# Patient Record
Sex: Female | Born: 1965
Health system: Southern US, Community
[De-identification: ages and names within clinical notes are randomized; demographics above are authoritative.]

## PROBLEM LIST (undated history)

## (undated) DIAGNOSIS — K029 Dental caries, unspecified: Secondary | ICD-10-CM

## (undated) DIAGNOSIS — G709 Myoneural disorder, unspecified: Secondary | ICD-10-CM

## (undated) DIAGNOSIS — E119 Type 2 diabetes mellitus without complications: Secondary | ICD-10-CM

## (undated) HISTORY — DX: Type 2 diabetes mellitus without complications: E11.9

## (undated) HISTORY — PX: LAPAROSCOPIC GASTRIC SLEEVE RESECTION: SHX5895

## (undated) HISTORY — DX: Myoneural disorder, unspecified: G70.9

## (undated) HISTORY — DX: Dental caries, unspecified: K02.9

---

## 2017-08-17 DIAGNOSIS — E1121 Type 2 diabetes mellitus with diabetic nephropathy: Secondary | ICD-10-CM | POA: Diagnosis not present

## 2017-08-17 DIAGNOSIS — N951 Menopausal and female climacteric states: Secondary | ICD-10-CM | POA: Diagnosis not present

## 2017-08-17 DIAGNOSIS — F17200 Nicotine dependence, unspecified, uncomplicated: Secondary | ICD-10-CM | POA: Diagnosis not present

## 2017-09-17 DIAGNOSIS — E1121 Type 2 diabetes mellitus with diabetic nephropathy: Secondary | ICD-10-CM | POA: Diagnosis not present

## 2017-09-17 DIAGNOSIS — M79604 Pain in right leg: Secondary | ICD-10-CM | POA: Diagnosis not present

## 2018-01-01 DIAGNOSIS — L0100 Impetigo, unspecified: Secondary | ICD-10-CM | POA: Diagnosis not present

## 2018-01-01 DIAGNOSIS — M79604 Pain in right leg: Secondary | ICD-10-CM | POA: Diagnosis not present

## 2018-01-01 DIAGNOSIS — R300 Dysuria: Secondary | ICD-10-CM | POA: Diagnosis not present

## 2018-01-01 DIAGNOSIS — E1121 Type 2 diabetes mellitus with diabetic nephropathy: Secondary | ICD-10-CM | POA: Diagnosis not present

## 2018-05-22 DIAGNOSIS — R6883 Chills (without fever): Secondary | ICD-10-CM | POA: Diagnosis not present

## 2018-05-22 DIAGNOSIS — B351 Tinea unguium: Secondary | ICD-10-CM | POA: Diagnosis not present

## 2018-05-22 DIAGNOSIS — F17210 Nicotine dependence, cigarettes, uncomplicated: Secondary | ICD-10-CM | POA: Diagnosis not present

## 2018-05-22 DIAGNOSIS — E119 Type 2 diabetes mellitus without complications: Secondary | ICD-10-CM | POA: Diagnosis not present

## 2018-05-22 DIAGNOSIS — K0889 Other specified disorders of teeth and supporting structures: Secondary | ICD-10-CM | POA: Diagnosis not present

## 2018-05-22 DIAGNOSIS — B379 Candidiasis, unspecified: Secondary | ICD-10-CM | POA: Diagnosis not present

## 2018-05-22 DIAGNOSIS — Z6833 Body mass index (BMI) 33.0-33.9, adult: Secondary | ICD-10-CM | POA: Diagnosis not present

## 2018-05-31 DIAGNOSIS — M545 Low back pain: Secondary | ICD-10-CM | POA: Diagnosis not present

## 2018-05-31 DIAGNOSIS — Y92481 Parking lot as the place of occurrence of the external cause: Secondary | ICD-10-CM | POA: Diagnosis not present

## 2018-05-31 DIAGNOSIS — Z9884 Bariatric surgery status: Secondary | ICD-10-CM | POA: Diagnosis not present

## 2018-05-31 DIAGNOSIS — E119 Type 2 diabetes mellitus without complications: Secondary | ICD-10-CM | POA: Diagnosis not present

## 2018-05-31 DIAGNOSIS — Z7984 Long term (current) use of oral hypoglycemic drugs: Secondary | ICD-10-CM | POA: Diagnosis not present

## 2018-05-31 DIAGNOSIS — F17210 Nicotine dependence, cigarettes, uncomplicated: Secondary | ICD-10-CM | POA: Diagnosis not present

## 2018-05-31 DIAGNOSIS — S39012A Strain of muscle, fascia and tendon of lower back, initial encounter: Secondary | ICD-10-CM | POA: Diagnosis not present

## 2018-05-31 DIAGNOSIS — Z79899 Other long term (current) drug therapy: Secondary | ICD-10-CM | POA: Diagnosis not present

## 2018-07-10 DIAGNOSIS — M542 Cervicalgia: Secondary | ICD-10-CM | POA: Diagnosis not present

## 2018-07-10 DIAGNOSIS — F17210 Nicotine dependence, cigarettes, uncomplicated: Secondary | ICD-10-CM | POA: Diagnosis not present

## 2018-07-10 DIAGNOSIS — W01198A Fall on same level from slipping, tripping and stumbling with subsequent striking against other object, initial encounter: Secondary | ICD-10-CM | POA: Diagnosis not present

## 2018-07-10 DIAGNOSIS — S39012A Strain of muscle, fascia and tendon of lower back, initial encounter: Secondary | ICD-10-CM | POA: Diagnosis not present

## 2018-07-10 DIAGNOSIS — Z6833 Body mass index (BMI) 33.0-33.9, adult: Secondary | ICD-10-CM | POA: Diagnosis not present

## 2018-07-10 DIAGNOSIS — M545 Low back pain: Secondary | ICD-10-CM | POA: Diagnosis not present

## 2018-07-10 DIAGNOSIS — E119 Type 2 diabetes mellitus without complications: Secondary | ICD-10-CM | POA: Diagnosis not present

## 2018-07-10 DIAGNOSIS — R202 Paresthesia of skin: Secondary | ICD-10-CM | POA: Diagnosis not present

## 2018-09-03 DIAGNOSIS — Z7984 Long term (current) use of oral hypoglycemic drugs: Secondary | ICD-10-CM | POA: Diagnosis not present

## 2018-09-03 DIAGNOSIS — F17210 Nicotine dependence, cigarettes, uncomplicated: Secondary | ICD-10-CM | POA: Diagnosis not present

## 2018-09-03 DIAGNOSIS — R509 Fever, unspecified: Secondary | ICD-10-CM | POA: Diagnosis not present

## 2018-09-03 DIAGNOSIS — K5900 Constipation, unspecified: Secondary | ICD-10-CM | POA: Diagnosis not present

## 2018-09-03 DIAGNOSIS — R1110 Vomiting, unspecified: Secondary | ICD-10-CM | POA: Diagnosis not present

## 2018-09-03 DIAGNOSIS — Z880 Allergy status to penicillin: Secondary | ICD-10-CM | POA: Diagnosis not present

## 2018-09-03 DIAGNOSIS — R05 Cough: Secondary | ICD-10-CM | POA: Diagnosis not present

## 2018-09-03 DIAGNOSIS — R197 Diarrhea, unspecified: Secondary | ICD-10-CM | POA: Diagnosis not present

## 2018-09-03 DIAGNOSIS — E119 Type 2 diabetes mellitus without complications: Secondary | ICD-10-CM | POA: Diagnosis not present

## 2018-10-13 DIAGNOSIS — J3489 Other specified disorders of nose and nasal sinuses: Secondary | ICD-10-CM | POA: Diagnosis not present

## 2018-10-13 DIAGNOSIS — E1142 Type 2 diabetes mellitus with diabetic polyneuropathy: Secondary | ICD-10-CM | POA: Diagnosis not present

## 2018-10-13 DIAGNOSIS — R05 Cough: Secondary | ICD-10-CM | POA: Diagnosis not present

## 2018-10-13 DIAGNOSIS — R110 Nausea: Secondary | ICD-10-CM | POA: Diagnosis not present

## 2018-10-13 DIAGNOSIS — J302 Other seasonal allergic rhinitis: Secondary | ICD-10-CM | POA: Diagnosis not present

## 2018-10-13 DIAGNOSIS — K219 Gastro-esophageal reflux disease without esophagitis: Secondary | ICD-10-CM | POA: Diagnosis not present

## 2018-10-13 DIAGNOSIS — F17210 Nicotine dependence, cigarettes, uncomplicated: Secondary | ICD-10-CM | POA: Diagnosis not present

## 2018-12-23 DIAGNOSIS — E1121 Type 2 diabetes mellitus with diabetic nephropathy: Secondary | ICD-10-CM | POA: Diagnosis not present

## 2018-12-23 DIAGNOSIS — G2581 Restless legs syndrome: Secondary | ICD-10-CM | POA: Diagnosis not present

## 2018-12-23 DIAGNOSIS — M5440 Lumbago with sciatica, unspecified side: Secondary | ICD-10-CM | POA: Diagnosis not present

## 2018-12-23 DIAGNOSIS — J019 Acute sinusitis, unspecified: Secondary | ICD-10-CM | POA: Diagnosis not present

## 2018-12-23 DIAGNOSIS — Z79899 Other long term (current) drug therapy: Secondary | ICD-10-CM | POA: Diagnosis not present

## 2018-12-25 DIAGNOSIS — M5416 Radiculopathy, lumbar region: Secondary | ICD-10-CM | POA: Diagnosis not present

## 2018-12-25 DIAGNOSIS — M6281 Muscle weakness (generalized): Secondary | ICD-10-CM | POA: Diagnosis not present

## 2018-12-25 DIAGNOSIS — M545 Low back pain: Secondary | ICD-10-CM | POA: Diagnosis not present

## 2018-12-27 DIAGNOSIS — E1121 Type 2 diabetes mellitus with diabetic nephropathy: Secondary | ICD-10-CM | POA: Diagnosis not present

## 2018-12-27 DIAGNOSIS — Z6838 Body mass index (BMI) 38.0-38.9, adult: Secondary | ICD-10-CM | POA: Diagnosis not present

## 2018-12-27 DIAGNOSIS — M5440 Lumbago with sciatica, unspecified side: Secondary | ICD-10-CM | POA: Diagnosis not present

## 2019-01-01 DIAGNOSIS — M5416 Radiculopathy, lumbar region: Secondary | ICD-10-CM | POA: Diagnosis not present

## 2019-01-01 DIAGNOSIS — M6281 Muscle weakness (generalized): Secondary | ICD-10-CM | POA: Diagnosis not present

## 2019-01-01 DIAGNOSIS — M545 Low back pain: Secondary | ICD-10-CM | POA: Diagnosis not present

## 2019-01-27 DIAGNOSIS — M5416 Radiculopathy, lumbar region: Secondary | ICD-10-CM | POA: Diagnosis not present

## 2019-01-27 DIAGNOSIS — M6281 Muscle weakness (generalized): Secondary | ICD-10-CM | POA: Diagnosis not present

## 2019-01-27 DIAGNOSIS — M545 Low back pain: Secondary | ICD-10-CM | POA: Diagnosis not present

## 2019-02-04 DIAGNOSIS — M545 Low back pain: Secondary | ICD-10-CM | POA: Diagnosis not present

## 2019-02-04 DIAGNOSIS — M6530 Trigger finger, unspecified finger: Secondary | ICD-10-CM | POA: Diagnosis not present

## 2019-02-04 DIAGNOSIS — M6281 Muscle weakness (generalized): Secondary | ICD-10-CM | POA: Diagnosis not present

## 2019-02-04 DIAGNOSIS — M5416 Radiculopathy, lumbar region: Secondary | ICD-10-CM | POA: Diagnosis not present

## 2019-02-06 DIAGNOSIS — E1121 Type 2 diabetes mellitus with diabetic nephropathy: Secondary | ICD-10-CM | POA: Diagnosis not present

## 2019-02-06 DIAGNOSIS — M65312 Trigger thumb, left thumb: Secondary | ICD-10-CM | POA: Diagnosis not present

## 2019-02-06 DIAGNOSIS — K219 Gastro-esophageal reflux disease without esophagitis: Secondary | ICD-10-CM | POA: Diagnosis not present

## 2019-02-06 DIAGNOSIS — M65332 Trigger finger, left middle finger: Secondary | ICD-10-CM | POA: Diagnosis not present

## 2019-03-03 DIAGNOSIS — M5416 Radiculopathy, lumbar region: Secondary | ICD-10-CM | POA: Diagnosis not present

## 2019-03-03 DIAGNOSIS — M6281 Muscle weakness (generalized): Secondary | ICD-10-CM | POA: Diagnosis not present

## 2019-03-03 DIAGNOSIS — M6530 Trigger finger, unspecified finger: Secondary | ICD-10-CM | POA: Diagnosis not present

## 2019-03-03 DIAGNOSIS — M545 Low back pain: Secondary | ICD-10-CM | POA: Diagnosis not present

## 2019-03-04 DIAGNOSIS — N390 Urinary tract infection, site not specified: Secondary | ICD-10-CM | POA: Diagnosis not present

## 2019-03-04 DIAGNOSIS — Z6839 Body mass index (BMI) 39.0-39.9, adult: Secondary | ICD-10-CM | POA: Diagnosis not present

## 2019-03-04 DIAGNOSIS — M65332 Trigger finger, left middle finger: Secondary | ICD-10-CM | POA: Diagnosis not present

## 2019-03-17 DIAGNOSIS — E1121 Type 2 diabetes mellitus with diabetic nephropathy: Secondary | ICD-10-CM | POA: Diagnosis not present

## 2019-03-17 DIAGNOSIS — N390 Urinary tract infection, site not specified: Secondary | ICD-10-CM | POA: Diagnosis not present

## 2019-05-28 DIAGNOSIS — E1121 Type 2 diabetes mellitus with diabetic nephropathy: Secondary | ICD-10-CM | POA: Diagnosis not present

## 2019-06-16 DIAGNOSIS — M65331 Trigger finger, right middle finger: Secondary | ICD-10-CM | POA: Diagnosis not present

## 2019-06-16 DIAGNOSIS — M65312 Trigger thumb, left thumb: Secondary | ICD-10-CM | POA: Diagnosis not present

## 2019-06-16 DIAGNOSIS — M65332 Trigger finger, left middle finger: Secondary | ICD-10-CM | POA: Diagnosis not present

## 2019-06-16 DIAGNOSIS — E1121 Type 2 diabetes mellitus with diabetic nephropathy: Secondary | ICD-10-CM | POA: Diagnosis not present

## 2019-09-03 DIAGNOSIS — J988 Other specified respiratory disorders: Secondary | ICD-10-CM | POA: Diagnosis not present

## 2019-09-03 DIAGNOSIS — Z20828 Contact with and (suspected) exposure to other viral communicable diseases: Secondary | ICD-10-CM | POA: Diagnosis not present

## 2019-09-10 DIAGNOSIS — H6690 Otitis media, unspecified, unspecified ear: Secondary | ICD-10-CM | POA: Diagnosis not present

## 2019-09-10 DIAGNOSIS — G2581 Restless legs syndrome: Secondary | ICD-10-CM | POA: Diagnosis not present

## 2019-09-10 DIAGNOSIS — E1121 Type 2 diabetes mellitus with diabetic nephropathy: Secondary | ICD-10-CM | POA: Diagnosis not present

## 2019-09-10 DIAGNOSIS — N63 Unspecified lump in unspecified breast: Secondary | ICD-10-CM | POA: Diagnosis not present

## 2019-10-29 DIAGNOSIS — R109 Unspecified abdominal pain: Secondary | ICD-10-CM | POA: Diagnosis not present

## 2019-10-29 DIAGNOSIS — Z20828 Contact with and (suspected) exposure to other viral communicable diseases: Secondary | ICD-10-CM | POA: Diagnosis not present

## 2019-12-08 DIAGNOSIS — Z79899 Other long term (current) drug therapy: Secondary | ICD-10-CM | POA: Diagnosis not present

## 2019-12-08 DIAGNOSIS — E1121 Type 2 diabetes mellitus with diabetic nephropathy: Secondary | ICD-10-CM | POA: Diagnosis not present

## 2019-12-08 DIAGNOSIS — Z6839 Body mass index (BMI) 39.0-39.9, adult: Secondary | ICD-10-CM | POA: Diagnosis not present

## 2019-12-08 DIAGNOSIS — M5440 Lumbago with sciatica, unspecified side: Secondary | ICD-10-CM | POA: Diagnosis not present

## 2019-12-08 DIAGNOSIS — B373 Candidiasis of vulva and vagina: Secondary | ICD-10-CM | POA: Diagnosis not present

## 2019-12-11 DIAGNOSIS — M65331 Trigger finger, right middle finger: Secondary | ICD-10-CM | POA: Diagnosis not present

## 2020-01-08 DIAGNOSIS — M2550 Pain in unspecified joint: Secondary | ICD-10-CM | POA: Diagnosis not present

## 2020-01-08 DIAGNOSIS — M5440 Lumbago with sciatica, unspecified side: Secondary | ICD-10-CM | POA: Diagnosis not present

## 2020-01-08 DIAGNOSIS — E1121 Type 2 diabetes mellitus with diabetic nephropathy: Secondary | ICD-10-CM | POA: Diagnosis not present

## 2020-01-08 DIAGNOSIS — Z6839 Body mass index (BMI) 39.0-39.9, adult: Secondary | ICD-10-CM | POA: Diagnosis not present

## 2020-02-02 DIAGNOSIS — M5440 Lumbago with sciatica, unspecified side: Secondary | ICD-10-CM | POA: Diagnosis not present

## 2020-02-02 DIAGNOSIS — E1165 Type 2 diabetes mellitus with hyperglycemia: Secondary | ICD-10-CM | POA: Diagnosis not present

## 2020-02-02 DIAGNOSIS — E1121 Type 2 diabetes mellitus with diabetic nephropathy: Secondary | ICD-10-CM | POA: Diagnosis not present

## 2020-02-03 DIAGNOSIS — R6889 Other general symptoms and signs: Secondary | ICD-10-CM | POA: Diagnosis not present

## 2020-03-08 ENCOUNTER — Ambulatory Visit: Payer: Self-pay | Admitting: Internal Medicine

## 2020-03-08 DIAGNOSIS — Z0289 Encounter for other administrative examinations: Secondary | ICD-10-CM

## 2020-04-26 ENCOUNTER — Ambulatory Visit: Payer: Self-pay | Admitting: Internal Medicine

## 2020-05-24 ENCOUNTER — Ambulatory Visit: Payer: Self-pay | Admitting: Internal Medicine

## 2020-05-24 ENCOUNTER — Encounter: Payer: Self-pay | Admitting: Internal Medicine

## 2020-05-24 DIAGNOSIS — Z0289 Encounter for other administrative examinations: Secondary | ICD-10-CM

## 2020-05-24 NOTE — Progress Notes (Deleted)
Name: Amber Vincent  MRN/ DOB: [id number], Mar 18, 1966   Age/ Sex: 54 y.o., female    PCP: No primary care provider on file.   Reason for Endocrinology Evaluation: Type 2 Diabetes Mellitus     Date of Initial Endocrinology Visit: 05/24/2020     PATIENT IDENTIFIER: Amber Vincent is a 54 y.o. female with a past medical history of T2Dm, Hx of weight loss surgery (gastric sleeve). The patient presented for initial endocrinology clinic visit on 05/24/2020 for consultative assistance with her diabetes management.    HPI: Amber Vincent was    Diagnosed with DM in 2017 Prior Medications tried/Intolerance: Bydureon (cost prohibitive), Trulicy- weak and achy all over. lantus caused facial breakout and switched to Toujeo. Ozempic started 01/2020 Currently checking blood sugars *** x / day,  before breakfast and ***.  Hypoglycemia episodes : ***               Symptoms: ***                 Frequency: ***/  Hemoglobin A1c has ranged from 9.5% in 2020, peaking at 13.5% in 2021. Patient required assistance for hypoglycemia:  Patient has required hospitalization within the last 1 year from hyper or hypoglycemia:   In terms of diet, the patient ***   HOME DIABETES REGIMEN: Metformin  Toujeo  Ozempic   Statin: {Yes/No:11203} ACE-I/ARB: {YES/NO:17245} Prior Diabetic Education: {Yes/No:11203}   METER DOWNLOAD SUMMARY: Date range evaluated: *** Fingerstick Blood Glucose Tests = *** Average Number Tests/Day = *** Overall Mean FS Glucose = *** Standard Deviation = ***  BG Ranges: Low = *** High = ***   Hypoglycemic Events/30 Days: BG < 50 = *** Episodes of symptomatic severe hypoglycemia = ***   DIABETIC COMPLICATIONS: Microvascular complications:   Neuropathy  Denies: ***  Last eye exam: Completed   Macrovascular complications:   ***  Denies: CAD, PVD, CVA   PAST HISTORY:  Past Medical History: No past medical history on file.  Past Surgical History: *** The  histories are not reviewed yet. Please review them in the "History" navigator section and refresh this SmartLink.   Social History:  has no history on file for tobacco, alcohol, and drug.  Family History: No family history on file.   HOME MEDICATIONS: Allergies as of 05/24/2020   Not on File     Medication List    as of May 24, 2020  8:09 AM   You have not been prescribed any medications.      ALLERGIES: Not on File   REVIEW OF SYSTEMS: A comprehensive ROS was conducted with the patient and is negative except as per HPI   OBJECTIVE:   VITAL SIGNS: There were no vitals taken for this visit.   PHYSICAL EXAM:  General: Pt appears well and is in NAD  HEENT:  Eyes: External eye exam normal without stare, lid lag or exophthalmos.  EOM intact  Neck: General: Supple without adenopathy or carotid bruits. Thyroid: Thyroid size normal.  No goiter or nodules appreciated. No thyroid bruit.  Lungs: Clear with good BS bilat with no rales, rhonchi, or wheezes  Heart: RRR with normal S1 and S2 and no gallops; no murmurs; no rub  Abdomen: Normoactive bowel sounds, soft, nontender, without masses or organomegaly palpable  Extremities:  Lower extremities - No pretibial edema. No lesions.  Skin: Normal texture and temperature to palpation.  Neuro: MS is good with appropriate affect, pt is alert and Ox3    DM  foot exam:    DATA REVIEWED: 01/08/2020 A1c 12.4% BUN/Cr 10/0.58 GFR 107        ASSESSMENT / PLAN / RECOMMENDATIONS:   1) Type 2 Diabetes Mellitus, Poorly controlled, With Neuropathic complications - Most recent A1c of *** %. Goal A1c < 7.0 %.    Plan: GENERAL:  ***  MEDICATIONS:  ***  EDUCATION / INSTRUCTIONS:  BG monitoring instructions: Patient is instructed to check her blood sugars *** times a day, ***.  Call [facility] if: BG persistently < 70 or > 300. . I reviewed the Rule of 15 for the treatment of hypoglycemia in detail with the  patient. Literature supplied.   2) Diabetic complications:   Eye: Does *** have known diabetic retinopathy.   Neuro/ Feet: Does *** have known diabetic peripheral neuropathy.  Renal: Patient does *** have known baseline CKD. She is *** on an ACEI/ARB at present.Check urine albumin/creatinine ratio yearly starting at time of diagnosis. If albuminuria is positive, treatment is geared toward better glucose, blood pressure control and use of ACE inhibitors or ARBs. Monitor electrolytes and creatinine once to twice yearly.   3) Lipids: Patient is *** on a statin.    4) Hypertension: ***  at goal of < 140/90 mmHg.       Signed electronically by: Mack Guise, MD  [facility] Endocrinology  [facility] [address] [place], [place] [address] Phone: [phone] FAX: [phone]   CC: No primary care provider on file. No primary provider on file. Phone: None  Fax: None    Return to Endocrinology clinic as below: Future Appointments  Date Time Provider [place]  05/24/2020  2:40 PM Barbette Mcglaun, Melanie Crazier, MD [facility] None

## 2020-06-23 DIAGNOSIS — F17210 Nicotine dependence, cigarettes, uncomplicated: Secondary | ICD-10-CM | POA: Diagnosis not present

## 2020-06-23 DIAGNOSIS — Z7984 Long term (current) use of oral hypoglycemic drugs: Secondary | ICD-10-CM | POA: Diagnosis not present

## 2020-06-23 DIAGNOSIS — R55 Syncope and collapse: Secondary | ICD-10-CM | POA: Diagnosis not present

## 2020-06-23 DIAGNOSIS — K0889 Other specified disorders of teeth and supporting structures: Secondary | ICD-10-CM | POA: Diagnosis not present

## 2020-06-23 DIAGNOSIS — R5383 Other fatigue: Secondary | ICD-10-CM | POA: Diagnosis not present

## 2020-06-23 DIAGNOSIS — E1165 Type 2 diabetes mellitus with hyperglycemia: Secondary | ICD-10-CM | POA: Diagnosis not present

## 2020-08-09 ENCOUNTER — Other Ambulatory Visit: Payer: Self-pay

## 2020-08-09 ENCOUNTER — Telehealth: Payer: Self-pay

## 2020-08-09 DIAGNOSIS — N644 Mastodynia: Secondary | ICD-10-CM

## 2020-08-09 DIAGNOSIS — N6320 Unspecified lump in the left breast, unspecified quadrant: Secondary | ICD-10-CM

## 2020-08-09 NOTE — Telephone Encounter (Signed)
Patient calls and states she has severe right breast pain with clear discharge from the nipple, lump in left breast ongoing x 6 months, lump is getting bigger, pain in right breast is severe, difficulty wearing a bra. She is concerned as her mom had breast cancer, was HER2+.  Patient states she is currently legally separated from her husband, who provides insurance Nurse, mental health) coverage, and no income, deductible is $3,000, needs to know if she can be seen. Patient informed (per Altha Harm) that she can be seen in our clinic, scheduled for 08/10/2020 @ 1:15pm. Patient accepted appointment, address provided.

## 2020-08-10 ENCOUNTER — Other Ambulatory Visit (HOSPITAL_COMMUNITY)
Admission: RE | Admit: 2020-08-10 | Discharge: 2020-08-10 | Disposition: A | Discharge status: Home / Self Care | Payer: BC Managed Care – PPO | Source: Ambulatory Visit | Attending: Obstetrics and Gynecology | Admitting: Obstetrics and Gynecology

## 2020-08-10 ENCOUNTER — Ambulatory Visit: Payer: BC Managed Care – PPO | Admitting: *Deleted

## 2020-08-10 ENCOUNTER — Other Ambulatory Visit: Payer: Self-pay

## 2020-08-10 ENCOUNTER — Ambulatory Visit: Payer: BC Managed Care – PPO

## 2020-08-10 VITALS — BP 135/80 | Temp 97.1°F | Wt 227.3 lb

## 2020-08-10 DIAGNOSIS — N61 Mastitis without abscess: Secondary | ICD-10-CM | POA: Diagnosis not present

## 2020-08-10 DIAGNOSIS — Z1211 Encounter for screening for malignant neoplasm of colon: Secondary | ICD-10-CM

## 2020-08-10 DIAGNOSIS — N6452 Nipple discharge: Secondary | ICD-10-CM | POA: Diagnosis not present

## 2020-08-10 DIAGNOSIS — Z1239 Encounter for other screening for malignant neoplasm of breast: Secondary | ICD-10-CM

## 2020-08-10 NOTE — Progress Notes (Signed)
Ms. Rhilyn Battle is a 54 y.o. female who presents to [facility] clinic today with complaint of left breast lump x 5 months that has increased in size, right breast clear spontaneous discharge x 2 months, and constant bilateral breast pain. Patient rates the pain at a 10 out of 10.  Pap Smear: Pap smear not completed today. Last Pap smear was 3 years ago at [facility] clinic and was normal per patient. Patient is due for a Pap smear. Patient refused Pap smear today. Patient informed of the importance of routine Pap smears and that the Pap smear is covered by [facility]. Told patient she can call to reschedule. Per patient has no history of an abnormal Pap smear. Last Pap smear result is not available in Epic.   Physical exam: Breasts Right breast slightly larger than left breast that per patient has changed over the past 7 months. No skin abnormalities bilateral breasts. No nipple retraction bilateral breasts. No nipple discharge left breast. Expressed a clear colored discharge from the right breast on exam. Sample of discharge sent to Cytology for evaluation. No lymphadenopathy. No lumps palpated right breast. Palpated a lump within the left breast at 6 o'clock 4 cm from the nipple. Complaints of bilateral diffuse breast pain on exam.     Pelvic/Bimanual Patient refused Pap smear today.   Smoking History: Patient is a current smoker. Discussed smoking cessation with patient. Referred to the [facility] Quitline and gave resources to the free smoking cessation classes at [facility].   Patient Navigation: Patient education provided. Access to services provided for patient through [facility] program.   Colorectal Cancer Screening: Per patient has never had colonoscopy completed. FIT test given to patient to complete and return to [facility]. No complaints today.    Breast and Cervical Cancer Risk Assessment: Patient has family history of her mother having breast cancer. Patient has no known genetic mutations or  history of radiation treatment to the chest before age 49. Patient does not have history of cervical dysplasia, immunocompromised, or DES exposure in-utero.  Risk Assessment    Risk Scores      08/10/2020   Last edited by: Narda Rutherford, LPN   5-year risk: 2 %   Lifetime risk: 15.5 %          A: [facility] exam without pap smear Complaint of right breast discharge, left breast lump, and bilateral breast pain.  P: Referred patient to the [facility] for a diagnostic mammogram. Appointment scheduled Thursday, August 12, 2020 at 1010.  Priscille Heidelberg, RN 08/10/2020 4:49 PM

## 2020-08-10 NOTE — Patient Instructions (Addendum)
Explained breast self awareness with Chip Boer. Patient refused Pap smear today. Let her know [facility] will cover Pap smears every 3 years unless has a history of abnormal Pap smears. Explained the importance of Pap smears and that she is due for a pap smear. Referred patient to the [facility] for a diagnostic mammogram. Appointment scheduled Thursday, August 12, 2020 at 1010. Patient aware of appointment and will be there. Let patient know will follow up with her within the next couple weeks with results of breast discharge by letter or phone. Discussed smoking cessation with patient. Referred to the [facility] Quitline and gave resources to the free smoking cessation classes at [facility]. Amber Vincent verbalized understanding.  Ronika Kelson, Kathaleen Maser, RN 4:49 PM

## 2020-08-11 ENCOUNTER — Other Ambulatory Visit: Payer: Self-pay

## 2020-08-11 ENCOUNTER — Ambulatory Visit (INDEPENDENT_AMBULATORY_CARE_PROVIDER_SITE_OTHER): Payer: BC Managed Care – PPO | Admitting: Internal Medicine

## 2020-08-11 ENCOUNTER — Encounter: Payer: Self-pay | Admitting: Internal Medicine

## 2020-08-11 VITALS — BP 140/79 | HR 102 | Temp 98.2°F | Ht 65.0 in | Wt 226.9 lb

## 2020-08-11 DIAGNOSIS — G8929 Other chronic pain: Secondary | ICD-10-CM | POA: Insufficient documentation

## 2020-08-11 DIAGNOSIS — N6320 Unspecified lump in the left breast, unspecified quadrant: Secondary | ICD-10-CM | POA: Insufficient documentation

## 2020-08-11 DIAGNOSIS — E1142 Type 2 diabetes mellitus with diabetic polyneuropathy: Secondary | ICD-10-CM | POA: Diagnosis not present

## 2020-08-11 DIAGNOSIS — Z9884 Bariatric surgery status: Secondary | ICD-10-CM

## 2020-08-11 DIAGNOSIS — G2581 Restless legs syndrome: Secondary | ICD-10-CM | POA: Diagnosis not present

## 2020-08-11 DIAGNOSIS — Z6837 Body mass index (BMI) 37.0-37.9, adult: Secondary | ICD-10-CM

## 2020-08-11 DIAGNOSIS — Z9889 Other specified postprocedural states: Secondary | ICD-10-CM

## 2020-08-11 DIAGNOSIS — K219 Gastro-esophageal reflux disease without esophagitis: Secondary | ICD-10-CM | POA: Diagnosis not present

## 2020-08-11 LAB — GLUCOSE, CAPILLARY: Glucose-Capillary: 410 mg/dL — ABNORMAL HIGH (ref 70–99)

## 2020-08-11 LAB — POCT GLYCOSYLATED HEMOGLOBIN (HGB A1C): Hemoglobin A1C: 11.1 % — AB (ref 4.0–5.6)

## 2020-08-11 LAB — CYTOLOGY - NON PAP

## 2020-08-11 MED ORDER — GABAPENTIN 600 MG PO TABS: 600.0000 mg | ORAL_TABLET | Freq: Three times a day (TID) | ORAL | 1 refills | Status: AC

## 2020-08-11 MED ORDER — PRAMIPEXOLE DIHYDROCHLORIDE 0.125 MG PO TABS: 0.1250 mg | ORAL_TABLET | Freq: Three times a day (TID) | ORAL | 1 refills | Status: AC

## 2020-08-11 MED ORDER — RYBELSUS 3 MG PO TABS: 1.0000 | ORAL_TABLET | Freq: Every day | ORAL | Status: AC

## 2020-08-11 MED ORDER — DULOXETINE HCL 20 MG PO CPEP: 20.0000 mg | ORAL_CAPSULE | Freq: Every day | ORAL | 1 refills | Status: AC

## 2020-08-11 MED ORDER — OMEPRAZOLE 40 MG PO CPDR: 40.0000 mg | DELAYED_RELEASE_CAPSULE | Freq: Two times a day (BID) | ORAL | 1 refills | Status: AC

## 2020-08-11 MED ORDER — METFORMIN HCL 1000 MG PO TABS: 1000.0000 mg | ORAL_TABLET | Freq: Two times a day (BID) | ORAL | 1 refills | Status: AC

## 2020-08-11 NOTE — Patient Instructions (Signed)
Thank you for trusting me with your care. To recap, today we discussed the following:  1. Type 2 diabetes mellitus with diabetic polyneuropathy, without long-term current use of insulin (HCC)  I will call tomorrow with results of your lab work.  - metFORMIN (GLUCOPHAGE) 1000 MG tablet; Take 1 tablet (1,000 mg total) by mouth 2 (two) times daily with a meal.  Dispense: 60 tablet; Refill: 1 - DULoxetine (CYMBALTA) 20 MG capsule; Take 1 capsule (20 mg total) by mouth daily.  Dispense: 30 capsule; Refill: 1 - gabapentin (NEURONTIN) 600 MG tablet; Take 1 tablet (600 mg total) by mouth 3 (three) times daily.  Dispense: 90 tablet; Refill: 1 - Semaglutide (RYBELSUS) 3 MG TABS; Take 1 tablet by mouth daily.  Dispense: 30 tablet - POC Hbg A1C - BMP8+Anion Gap  2. Restless leg syndrome  - pramipexole (MIRAPEX) 0.125 MG tablet; Take 1 tablet (0.125 mg total) by mouth 3 (three) times daily.  Dispense: 30 tablet; Refill: 1  3. Gastroesophageal reflux disease   - omeprazole (PRILOSEC) 40 MG capsule; Take 1 capsule (40 mg total) by mouth in the morning and at bedtime.  Dispense: 60 capsule; Refill: 1

## 2020-08-11 NOTE — Assessment & Plan Note (Signed)
Patient was recently seen in a clinic and referred to the [facility] for a mammogram for lump in left breast. The mammogram is scheduled for 8/26.

## 2020-08-11 NOTE — Assessment & Plan Note (Signed)
This is Amber Vincent's first visit at our clinic. She reports she could not afford her medical bill at her last clinic and was fired. She is out of all of her medications and we discussed we could refill most of them today. We can not refill her Percocet today without more information. RN had patient fill out forms to receive records. PDMP reviewed and patient hasn't been prescribed Percocet since May from PCP. She was receiving some by a dentist and says she has needed multiple teeth pooled recently.  Plan:  -Obtain medical records and review appropriateness of medication and other options tried for pain management

## 2020-08-11 NOTE — Assessment & Plan Note (Addendum)
Patient present for evaluation of T2DM. Reports her last A1c was around 11. Currently on Metformin, Glipizide, and Exenatide. She has peripheral neuropathy of hands and feet. Plans to obtain records from former PCP.  Assesment: Type 2 diabetes mellitus with diabetic polyneuropathy, without long-term current use of insulin (HCC) Plan: Refilled medication for patient. Stopped Glipizide ( needs weight loss and can cause weight gain) and Exenatide. Plan to have patient come back in a month to discuss how Semaglutide is going. She then can be titrated up on this medication. Her first month was given as a sample and also printed a prescription card so she can get the medication at $10 dollars per month in the future.     - metFORMIN (GLUCOPHAGE) 1000 MG tablet; Take 1 tablet (1,000 mg total) by mouth 2 (two) times daily with a meal.  Dispense: 60 tablet; Refill: 1 - DULoxetine (CYMBALTA) 20 MG capsule; Take 1 capsule (20 mg total) by mouth daily.  Dispense: 30 capsule; Refill: 1 - gabapentin (NEURONTIN) 600 MG tablet; Take 1 tablet (600 mg total) by mouth 3 (three) times daily.  Dispense: 90 tablet; Refill: 1 - Semaglutide (RYBELSUS) 3 MG TABS; Take 1 tablet by mouth daily.  Dispense: 30 tablet - POC Hbg A1C - BMP8+Anion Gap  Addendum: A1c 11.1 - Titrate up GLP and consider SGLT2 or long acting insulin. Patient has been out of medication , so this may not represent her average blood glucose back on medications.

## 2020-08-11 NOTE — Progress Notes (Signed)
   CC: Type 2 Diabetes Mellitus , peripheral neuropathy, restless leg syndrome  HPI:[name] is a 54 y.o. female who presents for evaluation of type 2 diabetes mellitus with peripheral neuropathy and restless leg syndrome. Please see individual problem based A/P for details.   Past Medical History:  Diagnosis Date  . Dental caries   . Diabetes mellitus without complication (HCC)   . Neuromuscular disorder (HCC)    neuropathy   Past Surgical History:  Procedure Laterality Date  . LAPAROSCOPIC GASTRIC SLEEVE RESECTION     Current Outpatient Medications on File Prior to Visit  Medication Sig Dispense Refill  . oxyCODONE-acetaminophen (PERCOCET) 10-325 MG tablet Take 1 tablet by mouth every 8 (eight) hours as needed for pain.     No current facility-administered medications on file prior to visit.   Allergies  Allergen Reactions  . Tramadol Itching   Family History  Problem Relation Age of Onset  . Diabetes Mother   . Rectal cancer Mother   . Breast cancer Mother   . Uterine cancer Mother   . Asthma Father   . Breast cancer Maternal Aunt   . Rectal cancer Maternal Aunt   . Breast cancer Maternal Aunt   . Rectal cancer Maternal Aunt    Social History   Tobacco Use  . Smoking status: Current Every Day Smoker    Packs/day: 1.50    Years: 20.00    Pack years: 30.00    Types: Cigarettes  . Smokeless tobacco: Never Used  . Tobacco comment: quit 9 days ago  Vaping Use  . Vaping Use: Never used  Substance Use Topics  . Alcohol use: Not Currently  . Drug use: Never   Review of Systems:   Review of Systems  Constitutional: Negative for chills and fever.  HENT: Negative for congestion and sinus pain.   Eyes: Negative for blurred vision and double vision.  Respiratory: Negative for cough and sputum production.   Cardiovascular: Negative for chest pain and palpitations.  Gastrointestinal: Negative for constipation and diarrhea.  Genitourinary: Negative for  dysuria and urgency.  Musculoskeletal: Positive for back pain. Negative for myalgias.  Skin: Negative for itching and rash.  Neurological: Positive for tingling (neuropathy). Negative for headaches.  Endo/Heme/Allergies: Negative for polydipsia. Does not bruise/bleed easily.  Psychiatric/Behavioral: Negative for depression and substance abuse.     Physical Exam: Vitals:   08/11/20 1320  BP: 140/79  Pulse: (!) 102  Temp: 98.2 F (36.8 C)  TempSrc: Oral  SpO2: 98%  Weight: 226 lb 14.4 oz (102.9 kg)  Height: 5\' 5"  (1.651 m)    General: NAD, nl appearance HE: Normocephalic, atraumatic , EOMI, Conjunctivae normal ENT: No congestion, no rhinorrhea, no exudate or erythema  Cardiovascular: Normal rate, regular rhythm.  No murmurs, rubs, or gallops Pulmonary : Effort normal, breath sounds normal. No wheezes, rales, or rhonchi Abdominal: soft, nontender,  bowel sounds present Musculoskeletal: no swelling , deformity, injury ,or tenderness in extremities, Skin: Warm, dry , no bruising, erythema, or rash Psychiatric/Behavioral:  normal mood, normal behavior    Assessment & Plan:   See Encounters Tab for problem based charting.  Patient discussed with Dr. 

## 2020-08-11 NOTE — Assessment & Plan Note (Signed)
Patient is new to our clinic , but reports history of sleeve gastrectomy ~ 10 years ago. Patient warned to avoid overusing NSAIDS. She reports she was 500lb before surgery and encouraged to continue to lose weight as she still is morbidly obese.

## 2020-08-11 NOTE — Assessment & Plan Note (Signed)
  Restless leg syndrome  Refilled - pramipexole (MIRAPEX) 0.125 MG tablet; Take 1 tablet (0.125 mg total) by mouth 3 (three) times daily.  Dispense: 30 tablet; Refill: 1

## 2020-08-12 ENCOUNTER — Ambulatory Visit
Admission: RE | Admit: 2020-08-12 | Discharge: 2020-08-12 | Disposition: A | Discharge status: Home / Self Care | Payer: BC Managed Care – PPO | Source: Ambulatory Visit | Attending: Obstetrics and Gynecology | Admitting: Obstetrics and Gynecology

## 2020-08-12 ENCOUNTER — Other Ambulatory Visit: Payer: Self-pay

## 2020-08-12 ENCOUNTER — Other Ambulatory Visit: Payer: BC Managed Care – PPO

## 2020-08-12 ENCOUNTER — Telehealth: Payer: Self-pay

## 2020-08-12 DIAGNOSIS — R928 Other abnormal and inconclusive findings on diagnostic imaging of breast: Secondary | ICD-10-CM | POA: Diagnosis not present

## 2020-08-12 DIAGNOSIS — N644 Mastodynia: Secondary | ICD-10-CM

## 2020-08-12 DIAGNOSIS — N6320 Unspecified lump in the left breast, unspecified quadrant: Secondary | ICD-10-CM

## 2020-08-12 DIAGNOSIS — N6489 Other specified disorders of breast: Secondary | ICD-10-CM | POA: Diagnosis not present

## 2020-08-12 DIAGNOSIS — N6452 Nipple discharge: Secondary | ICD-10-CM | POA: Diagnosis not present

## 2020-08-12 LAB — BMP8+ANION GAP
Anion Gap: 19 mmol/L — ABNORMAL HIGH (ref 10.0–18.0)
BUN/Creatinine Ratio: 13 (ref 9–23)
BUN: 9 mg/dL (ref 6–24)
CO2: 21 mmol/L (ref 20–29)
Calcium: 10 mg/dL (ref 8.7–10.2)
Chloride: 94 mmol/L — ABNORMAL LOW (ref 96–106)
Creatinine, Ser: 0.68 mg/dL (ref 0.57–1.00)
GFR calc Af Amer: 116 mL/min/{1.73_m2} (ref >59)
GFR calc non Af Amer: 101 mL/min/{1.73_m2} (ref >59)
Glucose: 418 mg/dL — ABNORMAL HIGH (ref 65–99)
Potassium: 4.5 mmol/L (ref 3.5–5.2)
Sodium: 134 mmol/L (ref 134–144)

## 2020-08-12 NOTE — Telephone Encounter (Signed)
Patient calls and states she was in route to the [facility] for her [facility], and had a flat tire, mammogram was rescheduled until 08/17/2020, doesn't feel she can wait until then. I contacted the [facility], appointment rescheduled for today at 2:50pm. Patient accepted appointment.  Patient informed pathology results for breast discharge are negative. Patient verbalized understanding.

## 2020-08-16 DIAGNOSIS — G8929 Other chronic pain: Secondary | ICD-10-CM | POA: Diagnosis not present

## 2020-08-16 DIAGNOSIS — Z79899 Other long term (current) drug therapy: Secondary | ICD-10-CM | POA: Diagnosis not present

## 2020-08-16 DIAGNOSIS — M5441 Lumbago with sciatica, right side: Secondary | ICD-10-CM | POA: Diagnosis not present

## 2020-08-16 NOTE — Progress Notes (Signed)
Internal Medicine Clinic Attending  Case discussed with Dr. Barbaraann Faster  At the time of the visit.  We reviewed the resident's history and exam and pertinent patient test results.  I agree with the assessment, diagnosis, and plan of care documented in the resident's note. Also for RLS, will need to check a ferritin and treat to value above 75 if needed.

## 2020-08-17 ENCOUNTER — Other Ambulatory Visit: Payer: BC Managed Care – PPO

## 2020-08-17 ENCOUNTER — Other Ambulatory Visit: Payer: Self-pay | Admitting: Obstetrics and Gynecology

## 2020-08-27 DIAGNOSIS — M5441 Lumbago with sciatica, right side: Secondary | ICD-10-CM | POA: Diagnosis not present

## 2020-08-27 DIAGNOSIS — G629 Polyneuropathy, unspecified: Secondary | ICD-10-CM | POA: Diagnosis not present

## 2020-08-27 DIAGNOSIS — E119 Type 2 diabetes mellitus without complications: Secondary | ICD-10-CM | POA: Diagnosis not present

## 2020-08-27 DIAGNOSIS — G8929 Other chronic pain: Secondary | ICD-10-CM | POA: Diagnosis not present

## 2020-09-01 ENCOUNTER — Other Ambulatory Visit: Payer: Self-pay | Admitting: General Surgery

## 2020-09-01 DIAGNOSIS — N6452 Nipple discharge: Secondary | ICD-10-CM

## 2020-09-02 LAB — FECAL OCCULT BLOOD, IMMUNOCHEMICAL: Fecal Occult Bld: POSITIVE — AB

## 2020-09-09 DIAGNOSIS — E119 Type 2 diabetes mellitus without complications: Secondary | ICD-10-CM | POA: Diagnosis not present

## 2020-09-09 DIAGNOSIS — M5441 Lumbago with sciatica, right side: Secondary | ICD-10-CM | POA: Diagnosis not present

## 2020-09-09 DIAGNOSIS — Z79899 Other long term (current) drug therapy: Secondary | ICD-10-CM | POA: Diagnosis not present

## 2020-09-09 DIAGNOSIS — G8929 Other chronic pain: Secondary | ICD-10-CM | POA: Diagnosis not present

## 2020-09-09 DIAGNOSIS — M797 Fibromyalgia: Secondary | ICD-10-CM | POA: Diagnosis not present

## 2020-09-09 DIAGNOSIS — G629 Polyneuropathy, unspecified: Secondary | ICD-10-CM | POA: Diagnosis not present

## 2020-09-10 ENCOUNTER — Telehealth: Payer: Self-pay | Admitting: Nurse Practitioner

## 2020-09-10 ENCOUNTER — Other Ambulatory Visit: Payer: Self-pay

## 2020-09-10 NOTE — Telephone Encounter (Signed)
Pls contact Sherie regarding [employer] [phone]

## 2020-09-10 NOTE — Telephone Encounter (Signed)
Please have her follow up in the clinic to discuss further evaluation

## 2020-09-10 NOTE — Telephone Encounter (Signed)
Return call made to [facility]).  CMA was informed that pt had a "positive" fit test (see result in epic).  Pt was seen by DrSteen on 08/11/20.  Will forward results to the appropriate team for recommendation and follow up.  Sherie will contact pt with results and pt will await call from [facility] on what to do next. (GI referral/labs/appt).    Please advise.Kingsley Spittle Cassady9/24/202110:14 AM

## 2020-09-10 NOTE — Telephone Encounter (Signed)
Contacted [facility] regarding positive FIT test results. Joyce Gross, RN with Internal Medicine call back and was informed results, stated she will pass the information to the PCP for recommendations.   Attempted to contact Patient, left message on voicemail requesting a return call.

## 2020-09-13 NOTE — Telephone Encounter (Signed)
Pt given appt for this Thurs 09/16/20 @ 1015 to f/u positive fit test. No further action needed, phone call complete.Amber Spittle Cassady9/27/20214:01 PM

## 2020-09-16 ENCOUNTER — Encounter: Payer: BC Managed Care – PPO | Admitting: Internal Medicine

## 2020-09-24 ENCOUNTER — Other Ambulatory Visit: Payer: BC Managed Care – PPO

## 2020-10-07 DIAGNOSIS — G8929 Other chronic pain: Secondary | ICD-10-CM | POA: Diagnosis not present

## 2020-10-07 DIAGNOSIS — M5441 Lumbago with sciatica, right side: Secondary | ICD-10-CM | POA: Diagnosis not present

## 2020-10-07 DIAGNOSIS — Z20822 Contact with and (suspected) exposure to covid-19: Secondary | ICD-10-CM | POA: Diagnosis not present

## 2020-10-07 DIAGNOSIS — F17210 Nicotine dependence, cigarettes, uncomplicated: Secondary | ICD-10-CM | POA: Diagnosis not present

## 2020-10-07 DIAGNOSIS — E119 Type 2 diabetes mellitus without complications: Secondary | ICD-10-CM | POA: Diagnosis not present

## 2020-10-07 DIAGNOSIS — R300 Dysuria: Secondary | ICD-10-CM | POA: Diagnosis not present

## 2020-10-07 DIAGNOSIS — E559 Vitamin D deficiency, unspecified: Secondary | ICD-10-CM | POA: Diagnosis not present

## 2020-10-07 DIAGNOSIS — Z79899 Other long term (current) drug therapy: Secondary | ICD-10-CM | POA: Diagnosis not present

## 2020-10-07 DIAGNOSIS — Z1159 Encounter for screening for other viral diseases: Secondary | ICD-10-CM | POA: Diagnosis not present

## 2020-10-14 ENCOUNTER — Other Ambulatory Visit: Payer: BC Managed Care – PPO

## 2020-10-28 DIAGNOSIS — U071 COVID-19: Secondary | ICD-10-CM | POA: Diagnosis not present

## 2020-10-29 DIAGNOSIS — Z23 Encounter for immunization: Secondary | ICD-10-CM | POA: Diagnosis not present

## 2020-10-29 DIAGNOSIS — U071 COVID-19: Secondary | ICD-10-CM | POA: Diagnosis not present

## 2020-11-05 DIAGNOSIS — Z1339 Encounter for screening examination for other mental health and behavioral disorders: Secondary | ICD-10-CM | POA: Diagnosis not present

## 2020-11-05 DIAGNOSIS — G8929 Other chronic pain: Secondary | ICD-10-CM | POA: Diagnosis not present

## 2020-11-05 DIAGNOSIS — E119 Type 2 diabetes mellitus without complications: Secondary | ICD-10-CM | POA: Diagnosis not present

## 2020-11-05 DIAGNOSIS — E7800 Pure hypercholesterolemia, unspecified: Secondary | ICD-10-CM | POA: Diagnosis not present

## 2020-11-05 DIAGNOSIS — Z79891 Long term (current) use of opiate analgesic: Secondary | ICD-10-CM | POA: Diagnosis not present

## 2020-11-05 DIAGNOSIS — Z8616 Personal history of COVID-19: Secondary | ICD-10-CM | POA: Diagnosis not present

## 2020-11-05 DIAGNOSIS — Z1331 Encounter for screening for depression: Secondary | ICD-10-CM | POA: Diagnosis not present

## 2020-11-05 DIAGNOSIS — M5441 Lumbago with sciatica, right side: Secondary | ICD-10-CM | POA: Diagnosis not present

## 2020-11-05 DIAGNOSIS — Z79899 Other long term (current) drug therapy: Secondary | ICD-10-CM | POA: Diagnosis not present

## 2020-11-05 DIAGNOSIS — F17210 Nicotine dependence, cigarettes, uncomplicated: Secondary | ICD-10-CM | POA: Diagnosis not present

## 2020-12-03 DIAGNOSIS — E119 Type 2 diabetes mellitus without complications: Secondary | ICD-10-CM | POA: Diagnosis not present

## 2020-12-03 DIAGNOSIS — G8929 Other chronic pain: Secondary | ICD-10-CM | POA: Diagnosis not present

## 2020-12-03 DIAGNOSIS — M5441 Lumbago with sciatica, right side: Secondary | ICD-10-CM | POA: Diagnosis not present

## 2020-12-03 DIAGNOSIS — Z79899 Other long term (current) drug therapy: Secondary | ICD-10-CM | POA: Diagnosis not present

## 2020-12-03 DIAGNOSIS — M797 Fibromyalgia: Secondary | ICD-10-CM | POA: Diagnosis not present

## 2021-09-10 IMAGING — US US BREAST*R* LIMITED INC AXILLA
1 series · 2 of 2 positions shown · non-contrast
Comparison: None.
COMPARISON: None.

Addendum:
CLINICAL DATA: Mass felt by the patient in the outer left breast
for the past 7 months. She reports that this became larger following
a left arm [id number] vaccine 3 months ago. Mass felt on recent
physical examination in the lower inner left breast. Spontaneous
clear nipple discharge on the right for the past 3 months.

EXAM:
DIGITAL DIAGNOSTIC BILATERAL MAMMOGRAM WITH CAD AND TOMO
ULTRASOUND BILATERAL BREAST

[Series 1: us breast*right* limited inc axilla · 0.06mm/px · 2 of 2 slices shown]
[im 1/2]
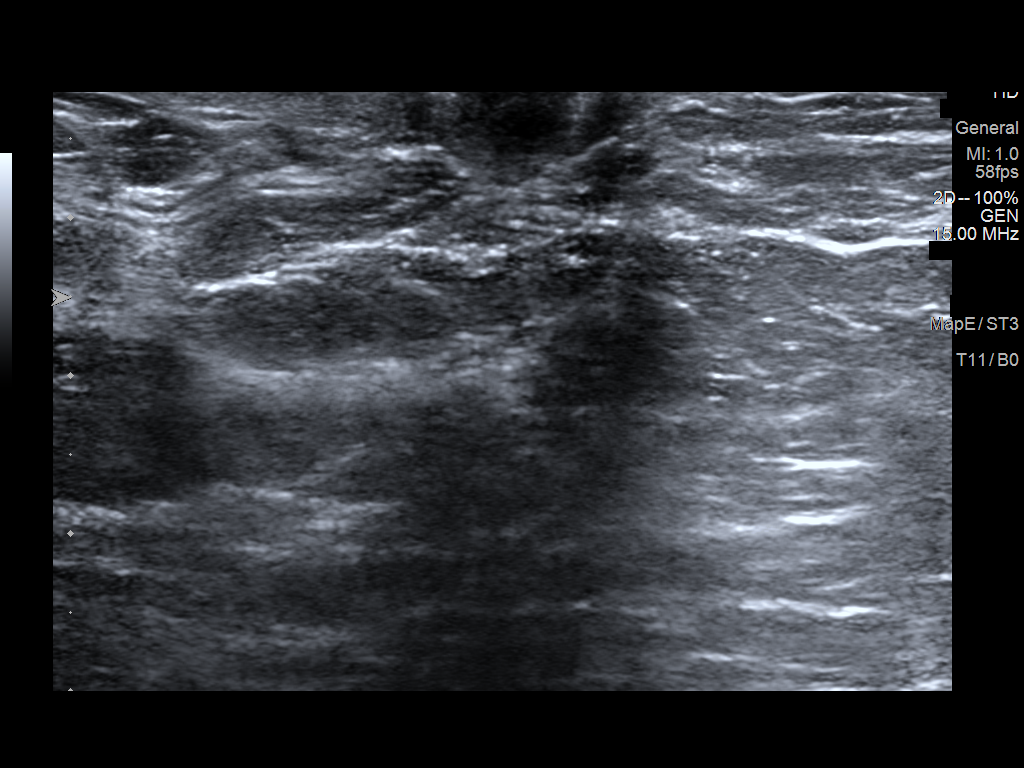
[im 2/2]
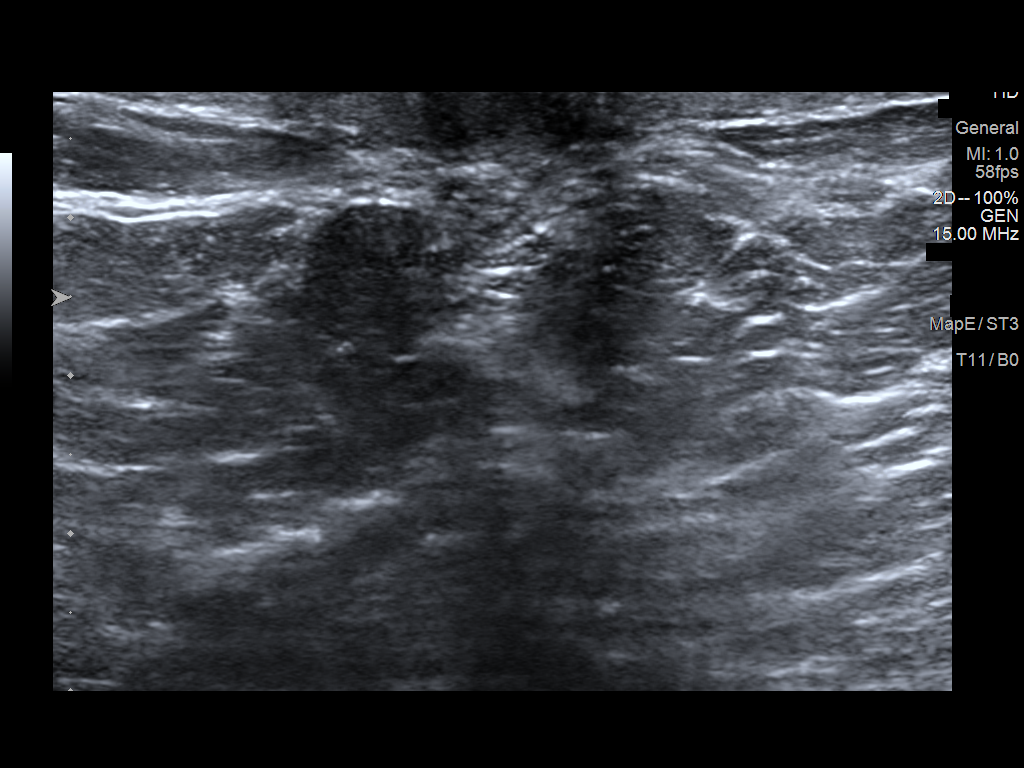

[2 of 2 positions shown; findings below may reference images not displayed]

ACR Breast Density Category b: There are scattered areas of
fibroglandular density.
FINDINGS: There is a focal asymmetry in the upper, slightly outer left breast
in the mid to posterior depth. On the initial images. This has the
appearance of normal fibroglandular tissue on spot compression
images.

There is no mammographic abnormality at the locations of palpable
concern on the left.

No abnormalities seen in the right breast to explain the spontaneous
nipple discharge.

Mammographic images were processed with CAD.

On physical exam, a tiny amount of clear nipple discharge was
elicited from a single orifice in the lower inner quadrant of the
right nipple. There were no palpable abnormalities in the right
retroareolar periareolar region.

No mass is palpable in the left breast at the locations of palpable
concern. There is no palpable thickening or mass in the upper left
breast at the location of mild mammographic asymmetry.

Targeted ultrasound is performed, showing normal appearing
retroareolar and periareolar breast tissue on the right with no
dilated ducts or masses seen.

Normal appearing fat lobules were demonstrated in the areas palpable
concern in the left breast.

Focal fibroglandular tissue with peaks and [REDACTED]s is demonstrated
in the 12:30 o'clock position of the left breast, 6 cm from the
nipple, corresponding to the mammographic asymmetry.
IMPRESSION: 1. Spontaneous, clear, single duct right nipple discharge. This is
concerning for a mammographically and sonographically occult
intraductal mass.
2. No evidence of malignancy in the left breast.

RECOMMENDATION:
Surgical consultation and bilateral breast MRI for the spontaneous,
clear, single duct right nipple discharge.

I have discussed the findings and recommendations with the patient.
If applicable, a reminder letter will be sent to the patient
regarding the next appointment.

BI-RADS CATEGORY  1: Negative.

ADDENDUM:
Surgical consultation has been arranged with Dr. Faisal Tovar at
[REDACTED] on August 30, 2020.

Lbahri Ley RN on 08/17/2020

*** End of Addendum ***
ACR Breast Density Category b: There are scattered areas of
fibroglandular density.
FINDINGS: There is a focal asymmetry in the upper, slightly outer left breast
in the mid to posterior depth. On the initial images. This has the
appearance of normal fibroglandular tissue on spot compression
images.

There is no mammographic abnormality at the locations of palpable
concern on the left.

No abnormalities seen in the right breast to explain the spontaneous
nipple discharge.

Mammographic images were processed with CAD.

On physical exam, a tiny amount of clear nipple discharge was
elicited from a single orifice in the lower inner quadrant of the
right nipple. There were no palpable abnormalities in the right
retroareolar periareolar region.

No mass is palpable in the left breast at the locations of palpable
concern. There is no palpable thickening or mass in the upper left
breast at the location of mild mammographic asymmetry.

Targeted ultrasound is performed, showing normal appearing
retroareolar and periareolar breast tissue on the right with no
dilated ducts or masses seen.

Normal appearing fat lobules were demonstrated in the areas palpable
concern in the left breast.

Focal fibroglandular tissue with peaks and [REDACTED]s is demonstrated
in the 12:30 o'clock position of the left breast, 6 cm from the
nipple, corresponding to the mammographic asymmetry.
IMPRESSION: 1. Spontaneous, clear, single duct right nipple discharge. This is
concerning for a mammographically and sonographically occult
intraductal mass.
2. No evidence of malignancy in the left breast.

RECOMMENDATION:
Surgical consultation and bilateral breast MRI for the spontaneous,
clear, single duct right nipple discharge.

I have discussed the findings and recommendations with the patient.
If applicable, a reminder letter will be sent to the patient
regarding the next appointment.

BI-RADS CATEGORY  1: Negative.

## 2021-09-10 IMAGING — MG DIGITAL DIAGNOSTIC BILAT W/ TOMO W/ CAD
7 of 19 series · 7 of 40 positions shown · non-contrast
Comparison: None.
COMPARISON: None.

Addendum:
CLINICAL DATA: Mass felt by the patient in the outer left breast
for the past 7 months. She reports that this became larger following
a left arm [id number] vaccine 3 months ago. Mass felt on recent
physical examination in the lower inner left breast. Spontaneous
clear nipple discharge on the right for the past 3 months.

EXAM:
DIGITAL DIAGNOSTIC BILATERAL MAMMOGRAM WITH CAD AND TOMO
ULTRASOUND BILATERAL BREAST

[L CC synth-2D (1 of 3)]
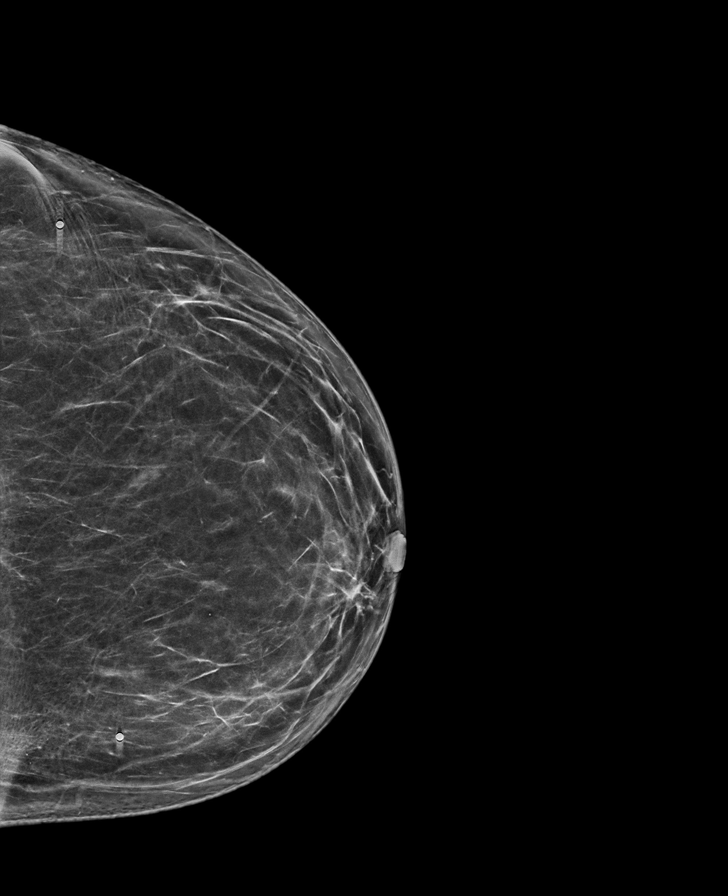

[L MLO synth-2D (1 of 2)]
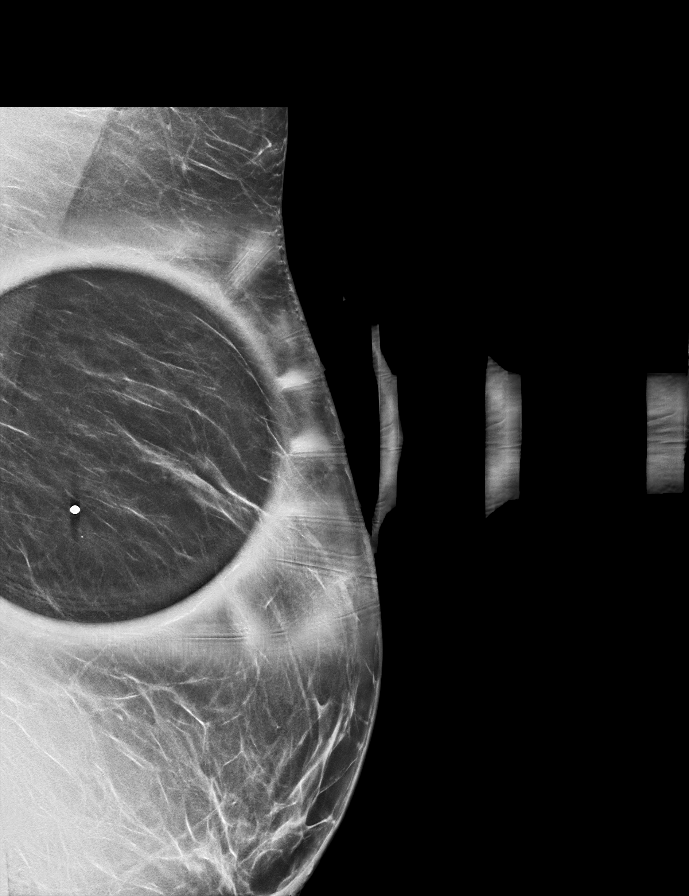

[L MLO synth-2D (2 of 2)]
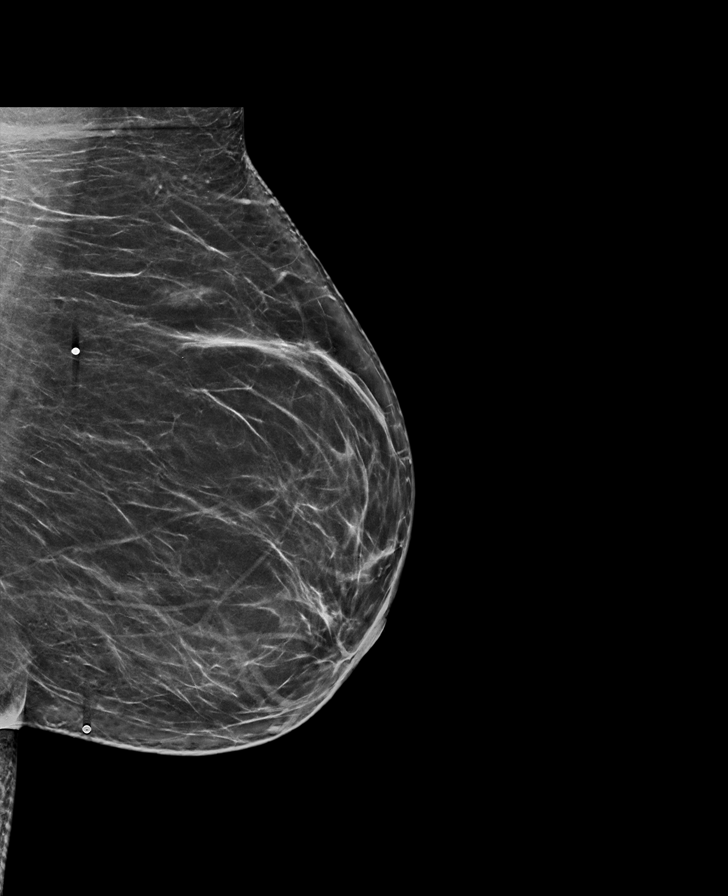

[R CC synth-2D]
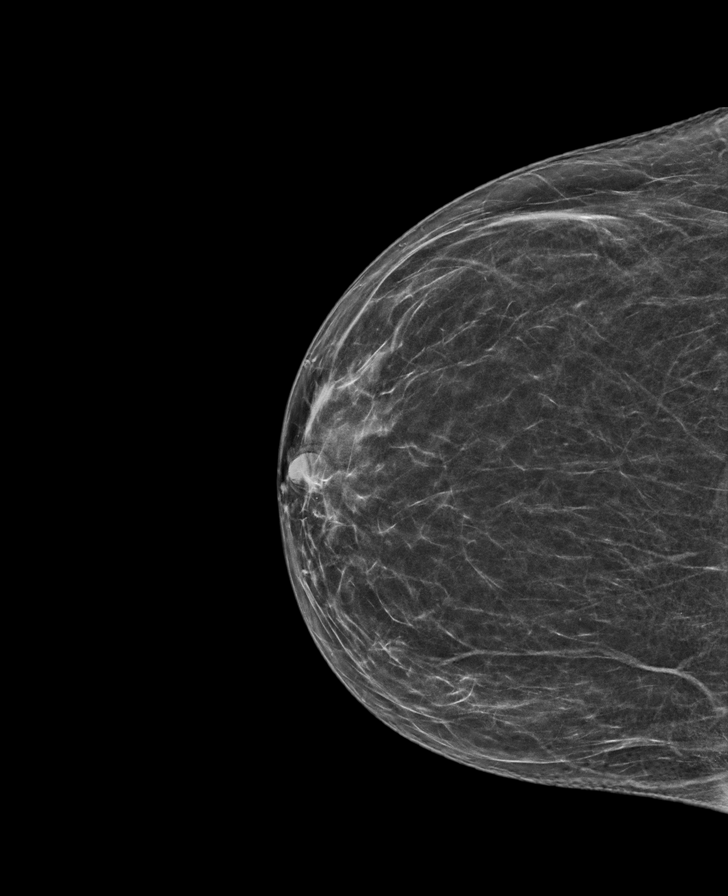

[L CC synth-2D (2 of 3)]
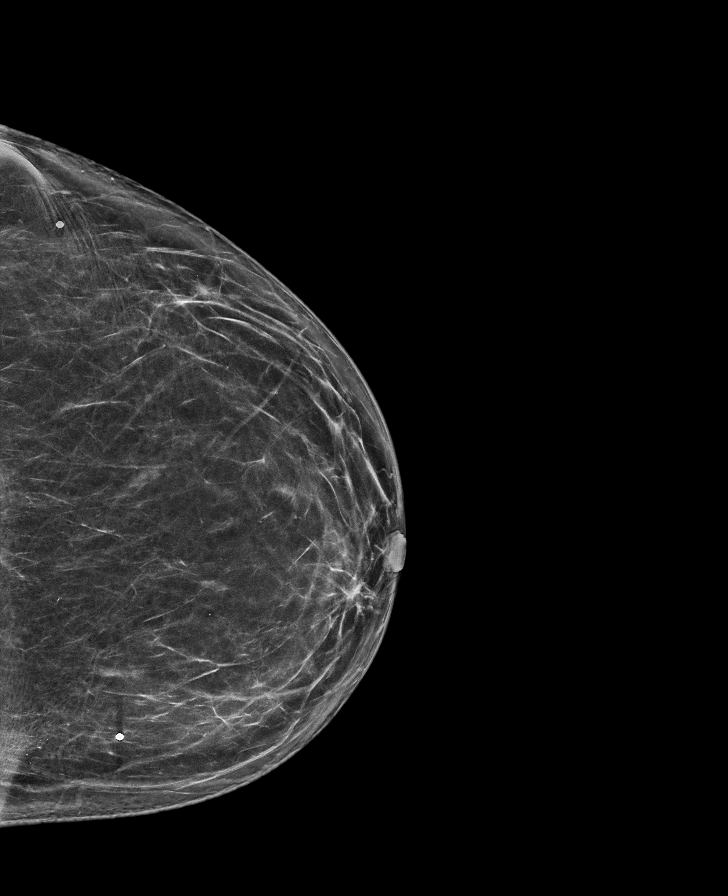

[L CC synth-2D (3 of 3)]
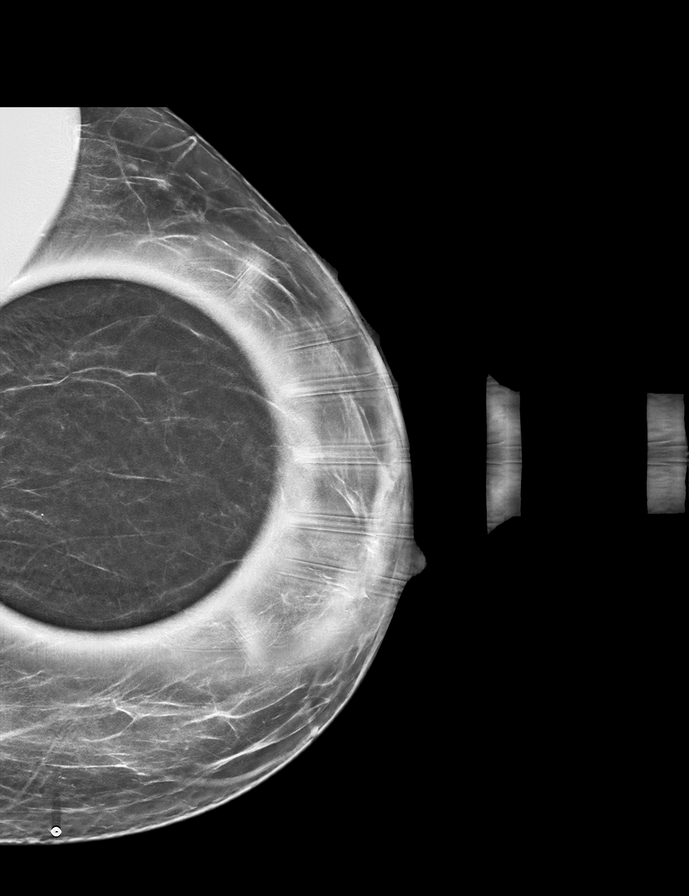

[L TAN synth-2D]
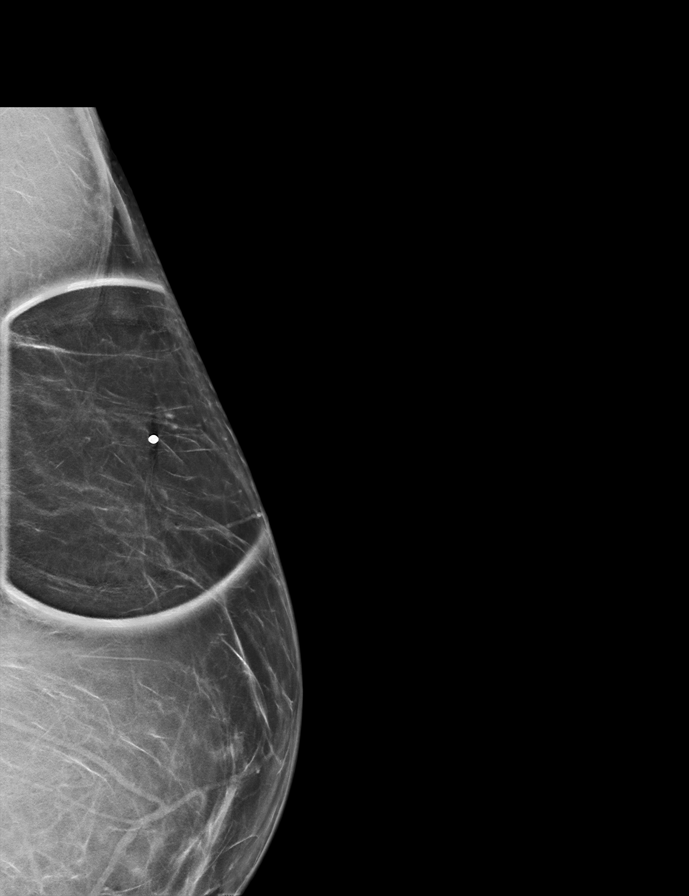

[7 of 40 positions shown; findings below may reference images not displayed]

ACR Breast Density Category b: There are scattered areas of
fibroglandular density.
FINDINGS: There is a focal asymmetry in the upper, slightly outer left breast
in the mid to posterior depth. On the initial images. This has the
appearance of normal fibroglandular tissue on spot compression
images.

There is no mammographic abnormality at the locations of palpable
concern on the left.

No abnormalities seen in the right breast to explain the spontaneous
nipple discharge.

Mammographic images were processed with CAD.

On physical exam, a tiny amount of clear nipple discharge was
elicited from a single orifice in the lower inner quadrant of the
right nipple. There were no palpable abnormalities in the right
retroareolar periareolar region.

No mass is palpable in the left breast at the locations of palpable
concern. There is no palpable thickening or mass in the upper left
breast at the location of mild mammographic asymmetry.

Targeted ultrasound is performed, showing normal appearing
retroareolar and periareolar breast tissue on the right with no
dilated ducts or masses seen.

Normal appearing fat lobules were demonstrated in the areas palpable
concern in the left breast.

Focal fibroglandular tissue with peaks and [REDACTED]s is demonstrated
in the 12:30 o'clock position of the left breast, 6 cm from the
nipple, corresponding to the mammographic asymmetry.
IMPRESSION: 1. Spontaneous, clear, single duct right nipple discharge. This is
concerning for a mammographically and sonographically occult
intraductal mass.
2. No evidence of malignancy in the left breast.

RECOMMENDATION:
Surgical consultation and bilateral breast MRI for the spontaneous,
clear, single duct right nipple discharge.

I have discussed the findings and recommendations with the patient.
If applicable, a reminder letter will be sent to the patient
regarding the next appointment.

BI-RADS CATEGORY  1: Negative.

ADDENDUM:
Surgical consultation has been arranged with Dr. Faisal Tovar at
[REDACTED] on August 30, 2020.

Lbahri Ley RN on 08/17/2020

*** End of Addendum ***
ACR Breast Density Category b: There are scattered areas of
fibroglandular density.
FINDINGS: There is a focal asymmetry in the upper, slightly outer left breast
in the mid to posterior depth. On the initial images. This has the
appearance of normal fibroglandular tissue on spot compression
images.

There is no mammographic abnormality at the locations of palpable
concern on the left.

No abnormalities seen in the right breast to explain the spontaneous
nipple discharge.

Mammographic images were processed with CAD.

On physical exam, a tiny amount of clear nipple discharge was
elicited from a single orifice in the lower inner quadrant of the
right nipple. There were no palpable abnormalities in the right
retroareolar periareolar region.

No mass is palpable in the left breast at the locations of palpable
concern. There is no palpable thickening or mass in the upper left
breast at the location of mild mammographic asymmetry.

Targeted ultrasound is performed, showing normal appearing
retroareolar and periareolar breast tissue on the right with no
dilated ducts or masses seen.

Normal appearing fat lobules were demonstrated in the areas palpable
concern in the left breast.

Focal fibroglandular tissue with peaks and [REDACTED]s is demonstrated
in the 12:30 o'clock position of the left breast, 6 cm from the
nipple, corresponding to the mammographic asymmetry.
IMPRESSION: 1. Spontaneous, clear, single duct right nipple discharge. This is
concerning for a mammographically and sonographically occult
intraductal mass.
2. No evidence of malignancy in the left breast.

RECOMMENDATION:
Surgical consultation and bilateral breast MRI for the spontaneous,
clear, single duct right nipple discharge.

I have discussed the findings and recommendations with the patient.
If applicable, a reminder letter will be sent to the patient
regarding the next appointment.

BI-RADS CATEGORY  1: Negative.
